# Patient Record
Sex: Female | Born: 1995 | Race: Black or African American | Hispanic: No | Marital: Single | Smoking: Former smoker
Health system: Southern US, Community
[De-identification: ages and names within clinical notes are randomized; demographics above are authoritative.]

## PROBLEM LIST (undated history)

## (undated) DIAGNOSIS — L509 Urticaria, unspecified: Secondary | ICD-10-CM

## (undated) HISTORY — DX: Urticaria, unspecified: L50.9

---
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
## 2017-04-08 ENCOUNTER — Encounter: Payer: Self-pay | Admitting: Allergy & Immunology

## 2017-04-08 ENCOUNTER — Ambulatory Visit (INDEPENDENT_AMBULATORY_CARE_PROVIDER_SITE_OTHER): Payer: Medicaid Other | Admitting: Allergy & Immunology

## 2017-04-08 VITALS — BP 118/70 | HR 88 | Resp 16 | Ht 65.0 in | Wt 226.0 lb

## 2017-04-08 DIAGNOSIS — L508 Other urticaria: Secondary | ICD-10-CM

## 2017-04-08 NOTE — Progress Notes (Signed)
FOLLOW UP  Date of Service/Encounter:  04/08/17   Assessment:   Chronic urticaria - minimally responsive to antihistamines  P/SAR (grasses, trees, weeds, dust mites, cat, dog) - without many clinical symptoms  Plan/Recommendations:   1. Chronic urticaria - Your history does not have any "red flags" such as fevers, joint pains, or permanent skin changes that would be concerning for a more serious cause of hives.  - We will get some labs to rule out serious causes of hives: complete blood count, complete metabolic panel, tryptase level, thyroid function testing, thyroid antibodies, CMP, ESR, and CRP. - Chronic hives are often times a self limited process and will "burn themselves out" over 6-12 months, although this is not always the case.  - In the meantime, start suppressive dosing of antihistamines:   - Morning: Xyzal (levocetirizine) 5-92m (one to two tablets)  - Evening: Singulair (montelukast) 122mnightly  - If the above is not working, try adding: Zantac (ranitidine) 30018mtwo tablets) - You can change this dosing at home, decreasing the dose as needed or increasing the dosing as needed.  - We will work on getting approval for Xolair (this should be approved by next week I anticipate). - Risks/benefits of Xolair discussed with the patient and she would like to proceed with this.  2. Return in about 3 months (around 07/06/2017).  Subjective:   Lauren Oconnell a 21 50o. female presenting today for follow up of  Chief Complaint  Patient presents with  . Urticaria    hives are coming back     Lauren Oconnell a history of the following: Patient Active Problem List   Diagnosis Date Noted  . Postpartum care and examination 08/24/2016    History obtained from: chart review and patient.  Lauren Oconnell's Primary Care Provider is Lauren Oconnell.     Lauren Oconnell a 21 55o. female presenting for a follow up visit. She has a history of chronic urticaria. I last saw  her in November 2017. At that time, her hives seems to have improved with her gestation (13 weeks at the time). I recommended that she use Claritin or Zyrtec as needed during her pregnancy.   Since the last visit, she has mostly done well. The remainder of her pregnancy did very well. She now has a nin75d boy named Lauren Oconnell Posto is meeting all of his milestones without a problem. He is now crawling and even taking 1-2 steps. He is a daddy's boy, which is somewhat bothersome to [facility] staying at home with Mom while his father works at Lauren Oconnell hives had remained under good control until around two months ago. She started having breakouts from unknown triggers, which she was treating with Benadryl. This does provide very transient relief. She has been on cetirizine in the past, which did not seem to provide much control. 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 She is interested in additional therapies at this time.   She denies problems with fevers or joint pains. There are no permanent skin changes whatsoever. She denies problems with her thyroid function. She did have environmental allergy testing performed in June 2017 that showed positives to grasses, weeds, trees, dog, cat, and dust mites.   Otherwise, there have been no changes to her past medical history, surgical history, family history, or social history.    Review of  Systems: a 14-point review of systems is pertinent for what is mentioned in HPI.  Otherwise, all other systems were negative. Constitutional: negative other than that listed in the HPI Eyes: negative other than that listed in the HPI Ears, nose, mouth, throat, and face: negative other than that listed in the HPI Respiratory: negative other than that listed in the HPI Cardiovascular: negative other than that listed in the HPI Gastrointestinal: negative other than that listed in the  HPI Genitourinary: negative other than that listed in the HPI Integument: negative other than that listed in the HPI Hematologic: negative other than that listed in the HPI Musculoskeletal: negative other than that listed in the HPI Neurological: negative other than that listed in the HPI Allergy/Immunologic: negative other than that listed in the HPI    Objective:   Blood pressure 118/70, pulse 88, resp. rate 16, height _0  (1.651 m), weight 226 lb (102.5 kg), not currently breastfeeding. Body mass index is 37.61 kg/m.   Physical Exam:  General: Alert, interactive, in no acute distress. Pleasant female. Smiling.  Eyes: No conjunctival injection bilaterally, no discharge on the right, no discharge on the left and no Horner-Trantas dots present. PERRL bilaterally. EOMI without pain. No photophobia.  Ears: Right TM pearly gray with normal light reflex, Left TM pearly gray with normal light reflex, Right TM intact without perforation and Left TM intact without perforation.  Nose/Throat: External nose within normal limits and septum midline. Turbinates edematous and pale with clear discharge. Posterior oropharynx erythematous without cobblestoning in the posterior oropharynx. Tonsils 2+ without exudates.  Tongue without thrush. Adenopathy: no enlarged lymph nodes appreciated in the anterior cervical, occipital, axillary, epitrochlear, inguinal, or popliteal regions. Lungs: Clear to auscultation without wheezing, rhonchi or rales. No increased work of breathing. CV: Normal S1/S2. No murmurs. Capillary refill <2 seconds.  Skin: Scattered erythematous urticarial type lesions primarily located bilateral arms. No dermatographism present. Nonvesicular. Neuro:   Grossly intact. No focal deficits appreciated. Responsive to questions.  Diagnostic studies: none      Lauren Marvel, MD [place] [facility] [place]

## 2017-04-08 NOTE — Patient Instructions (Addendum)
1. Chronic urticaria - Your history does not have any "red flags" such as fevers, joint pains, or permanent skin changes that would be concerning for a more serious cause of hives.  - We will get some labs to rule out serious causes of hives: complete blood count, complete metabolic panel, tryptase level, thyroid function testing, thyroid antibodies, CMP, ESR, and CRP. - Chronic hives are often times a self limited process and will "burn themselves out" over 6-12 months, although this is not always the case.  - In the meantime, start suppressive dosing of antihistamines:   - Morning: Xyzal (levocetirizine) 5-40m (one to two tablets)  - Evening: Singulair (montelukast) 158mnightly  - If the above is not working, try adding: Zantac (ranitidine) 30057mtwo tablets) - You can change this dosing at home, decreasing the dose as needed or increasing the dosing as needed.  - We will work on getting approval for Xolair (this should be approved by next week I anticipate).  2. Return in about 3 months (around 07/06/2017).   Please inform us [place] any Emergency Department visits, hospitalizations, or changes in symptoms. Call us [place] going to the ED for breathing or allergy symptoms since we might be able to fit you in for a sick visit. Feel free to contact us [place] with any questions, problems, or concerns.  It was a pleasure to see you again today! Happy New Year! Bring CarEulas Postxt time! I can't wait to meet the little dude!   Websites that have reliable patient information: 1. American Academy of Asthma, Allergy, and Immunology: www.aaaai.org 2. Food Allergy Research and Education (FARE): foodallergy.org 3. Mothers of Asthmatics: http://www.asthmacommunitynetwork.org 4. American College of Allergy, Asthma, and Immunology: www.acaai.org

## 2017-04-15 ENCOUNTER — Telehealth: Payer: Self-pay | Admitting: *Deleted

## 2017-04-15 NOTE — Telephone Encounter (Signed)
Per Dr Dellis AnesGallagher mail patient dust mite avoidance measures. Mailed to patient

## 2017-04-19 ENCOUNTER — Encounter: Payer: Self-pay | Admitting: *Deleted

## 2017-04-19 LAB — ALLERGENS, ZONE 2
Alternaria Alternata IgE: <0.1 kU/L
Amer Sycamore IgE Qn: <0.1 kU/L
Aspergillus Fumigatus IgE: <0.1 kU/L
Bahia Grass IgE: <0.1 kU/L
Cat Dander IgE: <0.1 kU/L
Cedar, Mountain IgE: 0.12 kU/L — AB
Cladosporium Herbarum IgE: <0.1 kU/L
D Farinae IgE: 9.12 kU/L — AB
D Pteronyssinus IgE: 6.24 kU/L — AB
Dog Dander IgE: 0.52 kU/L — AB
Hickory, White IgE: 0.52 kU/L — AB
I206-IGE COCKROACH, AMERICAN: 0.87 kU/L — AB
Maple/Box Elder IgE: <0.1 kU/L
Mugwort IgE Qn: <0.1 kU/L
Nettle IgE: 0.14 kU/L — AB
Penicillium Chrysogen IgE: <0.1 kU/L
Pigweed, Rough IgE: 0.14 kU/L — AB
Ragweed, Short IgE: <0.1 kU/L
Sheep Sorrel IgE Qn: <0.1 kU/L
Stemphylium Herbarum IgE: <0.1 kU/L
TIMOTHY IGE: 0.13 kU/L — AB
W009-IGE PLANTAIN, ENGLISH: 0.12 kU/L — AB
White Mulberry IgE: <0.1 kU/L

## 2017-04-19 LAB — C-REACTIVE PROTEIN: CRP: 9.9 mg/L — AB (ref 0.0–4.9)

## 2017-04-19 LAB — COMPREHENSIVE METABOLIC PANEL
ALBUMIN: 4.5 g/dL (ref 3.5–5.5)
ALK PHOS: 98 IU/L (ref 39–117)
ALT: 12 IU/L (ref 0–32)
AST: 14 IU/L (ref 0–40)
Albumin/Globulin Ratio: 1.6 (ref 1.2–2.2)
BUN / CREAT RATIO: 12 (ref 9–23)
BUN: 8 mg/dL (ref 6–20)
Bilirubin Total: <0.2 mg/dL (ref 0.0–1.2)
CO2: 22 mmol/L (ref 20–29)
CREATININE: 0.69 mg/dL (ref 0.57–1.00)
Calcium: 9.3 mg/dL (ref 8.7–10.2)
Chloride: 108 mmol/L — ABNORMAL HIGH (ref 96–106)
GFR calc Af Amer: 146 mL/min/{1.73_m2} (ref >59)
GFR calc non Af Amer: 127 mL/min/{1.73_m2} (ref >59)
GLOBULIN, TOTAL: 2.9 g/dL (ref 1.5–4.5)
Glucose: 82 mg/dL (ref 65–99)
Potassium: 4.2 mmol/L (ref 3.5–5.2)
SODIUM: 139 mmol/L (ref 134–144)
Total Protein: 7.4 g/dL (ref 6.0–8.5)

## 2017-04-19 LAB — TRYPTASE: TRYPTASE: 5.2 ug/L (ref 2.2–13.2)

## 2017-04-19 LAB — CBC
HEMATOCRIT: 38.8 % (ref 34.0–46.6)
HEMOGLOBIN: 12.8 g/dL (ref 11.1–15.9)
MCH: 26.7 pg (ref 26.6–33.0)
MCHC: 33 g/dL (ref 31.5–35.7)
MCV: 81 fL (ref 79–97)
Platelets: 314 10*3/uL (ref 150–379)
RBC: 4.79 x10E6/uL (ref 3.77–5.28)
RDW: 14.9 % (ref 12.3–15.4)
WBC: 6.6 10*3/uL (ref 3.4–10.8)

## 2017-04-19 LAB — TSH+T3+FREE T4+T3 FREE
Free T-3: 2.9 pg/mL
Free T4 by Dialysis: 0.54 ng/dL
TSH: 1.4 uU/mL
Triiodothyronine (T-3), Serum: 127 ng/dL

## 2017-04-19 LAB — SEDIMENTATION RATE: SED RATE: 30 mm/h (ref 0–32)

## 2017-04-19 LAB — ANA: ANA: NEGATIVE

## 2017-04-19 LAB — THYROGLOBULIN LEVEL: Thyroglobulin (TG-RIA): 110 ng/mL — ABNORMAL HIGH

## 2017-04-19 LAB — THYROID PEROXIDASE ANTIBODY: Thyroperoxidase Ab SerPl-aCnc: 20 IU/mL (ref 0–26)

## 2017-04-22 ENCOUNTER — Ambulatory Visit: Payer: Medicaid Other

## 2017-04-29 ENCOUNTER — Ambulatory Visit (INDEPENDENT_AMBULATORY_CARE_PROVIDER_SITE_OTHER): Payer: Medicaid Other | Admitting: *Deleted

## 2017-04-29 DIAGNOSIS — L508 Other urticaria: Secondary | ICD-10-CM

## 2017-04-29 DIAGNOSIS — L501 Idiopathic urticaria: Secondary | ICD-10-CM

## 2017-04-29 MED ORDER — OMALIZUMAB 150 MG ~~LOC~~ SOLR
300.0000 mg | SUBCUTANEOUS | Status: AC
  Administered 2017-04-29 – 2019-01-16 (×3): 300 mg via SUBCUTANEOUS

## 2017-04-29 NOTE — Progress Notes (Signed)
Immunotherapy   Patient Details  Name: Lauren Oconnell MRN: [id number] Date of Birth: 10/21/1995  04/29/2017  Lauren Oconnell started injections for  Xolair 300 mg every 28 days. Epi-Pen:Epi-Pen Available  Consent signed and patient instructions given. No problems after 60 minutes in the office.   Lauren DuvalHeather L Regnald Bowens 04/29/2017, 6:06 PM

## 2017-05-27 ENCOUNTER — Ambulatory Visit: Payer: Medicaid Other

## 2018-01-16 ENCOUNTER — Emergency Department (HOSPITAL_COMMUNITY)
Admission: EM | Admit: 2018-01-16 | Discharge: 2018-01-16 | Disposition: A | Discharge status: Home / Self Care | Payer: Medicaid Other | Attending: Emergency Medicine | Admitting: Emergency Medicine

## 2018-01-16 ENCOUNTER — Encounter: Payer: Self-pay | Admitting: Emergency Medicine

## 2018-01-16 DIAGNOSIS — J029 Acute pharyngitis, unspecified: Secondary | ICD-10-CM

## 2018-01-16 DIAGNOSIS — R51 Headache: Secondary | ICD-10-CM | POA: Insufficient documentation

## 2018-01-16 DIAGNOSIS — F17200 Nicotine dependence, unspecified, uncomplicated: Secondary | ICD-10-CM | POA: Insufficient documentation

## 2018-01-16 DIAGNOSIS — R110 Nausea: Secondary | ICD-10-CM | POA: Insufficient documentation

## 2018-01-16 LAB — GROUP A STREP BY PCR: GROUP A STREP BY PCR: NOT DETECTED

## 2018-01-16 MED ORDER — DEXAMETHASONE 10 MG/ML FOR PEDIATRIC ORAL USE
10.0000 mg | Freq: Once | INTRAMUSCULAR | Status: AC
  Administered 2018-01-16: 10 mg via ORAL
  Filled 2018-01-16 (×2): qty 1

## 2018-01-16 MED ORDER — IBUPROFEN 100 MG/5ML PO SUSP
600.0000 mg | Freq: Once | ORAL | Status: AC
Start: 2018-01-16 — End: 2018-01-16
  Administered 2018-01-16: 600 mg via ORAL
  Filled 2018-01-16: qty 30

## 2018-01-16 NOTE — ED Notes (Signed)
Pt stable, ambulatory, states understanding of discharge instructions 

## 2018-01-16 NOTE — ED Provider Notes (Signed)
[facility] EMERGENCY DEPARTMENT Provider Note   CSN: [id number] Arrival date & time: 01/16/18  1506     History   Chief Complaint Chief Complaint  Patient presents with  . Sore Throat    HPI Lauren Oconnell is a 22 y.o. female.  22 year old female presents with complaint of sore throat onset last night.  Pain is worse with swallowing.  Denies associated sneezing, cough, congestion.  No sick contacts.  Patient states she looked in her throat this afternoon and noticed white patches on her tonsils.  Also tender cervical lymph nodes, with mild headache and nausea. Denies vomiting, fevers, chills. No other complaints.      Past Medical History:  Diagnosis Date  . Urticaria     Patient Active Problem List   Diagnosis Date Noted  . Postpartum care and examination 08/24/2016    Past Surgical History:  Procedure Laterality Date  . WISDOM TOOTH EXTRACTION  2016     OB History    Gravida  1   Para  1   Term  1   Preterm      AB      Living  1     SAB      TAB      Ectopic      Multiple      Live Births  1            Home Medications    Prior to Admission medications   Medication Sig Start Date End Date Taking? Authorizing Provider  diphenhydrAMINE (BENADRYL) 25 mg capsule Take 25 mg by mouth every 6 (six) hours as needed.    [provider]    Family History Family History  Problem Relation Age of Onset  . Asthma Brother   . Allergic rhinitis Brother   . Food Allergy Mother   . Hypertension Mother   . Angioedema Neg Hx   . Eczema Neg Hx   . Immunodeficiency Neg Hx   . Urticaria Neg Hx     Social History Social History   Tobacco Use  . Smoking status: Current Some Day Smoker  . Smokeless tobacco: Never Used  Substance Use Topics  . Alcohol use: No  . Drug use: Yes    Types: Marijuana     Allergies   Patient has no known allergies.   Review of Systems Review of Systems  Constitutional: Negative  for chills and fever.  HENT: Positive for sore throat. Negative for congestion, ear pain, postnasal drip, rhinorrhea, sinus pressure, sinus pain, sneezing and trouble swallowing.   Eyes: Negative for discharge and redness.  Respiratory: Negative for cough.   Gastrointestinal: Positive for nausea. Negative for vomiting.  Musculoskeletal: Negative for neck pain and neck stiffness.  Skin: Negative for rash and wound.  Allergic/Immunologic: Negative for immunocompromised state.  Neurological: Positive for headaches. Negative for weakness.  Hematological: Positive for adenopathy.  Psychiatric/Behavioral: Negative for confusion.  All other systems reviewed and are negative.    Physical Exam Updated Vital Signs BP 134/82 (BP Location: Right Arm)   Pulse 87   Temp 98.3 F (36.8 C) (Oral)   Resp 17   Ht 5\' 4"  (1.626 m)   Wt 102.5 kg   LMP 01/09/2018 (Exact Date)   SpO2 100%   BMI 38.79 kg/m   Physical Exam  Constitutional: She is oriented to person, place, and time. She appears well-developed and well-nourished. No distress.  HENT:  Head: Normocephalic and atraumatic.  Right Ear:  Tympanic membrane and ear canal normal.  Left Ear: Tympanic membrane and ear canal normal.  Mouth/Throat: Uvula is midline and mucous membranes are normal. No uvula swelling. No oropharyngeal exudate, posterior oropharyngeal edema, posterior oropharyngeal erythema or tonsillar abscesses. Tonsils are 2+ on the right. Tonsils are 2+ on the left. Tonsillar exudate.  Cardiovascular: Normal rate, regular rhythm and normal heart sounds.  No murmur heard. Pulmonary/Chest: Effort normal and breath sounds normal.  Lymphadenopathy:       Head (right side): Tonsillar adenopathy present.       Head (left side): Tonsillar adenopathy present.    She has cervical adenopathy.       Right cervical: Superficial cervical adenopathy present.       Left cervical: Superficial cervical adenopathy present.  Neurological: She is  alert and oriented to person, place, and time.  Skin: Skin is warm and dry. She is not diaphoretic.  Psychiatric: She has a normal mood and affect. Her behavior is normal.  Nursing note and vitals reviewed.    ED Treatments / Results  Labs (all labs ordered are listed, but only abnormal results are displayed) Labs Reviewed  GROUP A STREP BY PCR    EKG None  Radiology No results found.  Procedures Procedures (including critical care time)  Medications Ordered in ED Medications  dexamethasone (DECADRON) 10 MG/ML injection for Pediatric ORAL use 10 mg (10 mg Oral Given 01/16/18 1607)  ibuprofen (ADVIL,MOTRIN) 100 MG/5ML suspension 600 mg (600 mg Oral Given 01/16/18 1607)     Initial Impression / Assessment and Plan / ED Course  I have reviewed the triage vital signs and the nursing notes.  Pertinent labs & imaging results that were available during my care of the patient were reviewed by me and considered in my medical decision making (see chart for details).  Clinical Course as of Jan 16 1814  Wynelle LinkSun Jan 16, 2018  [id number] 22 year old female with complaint of sore throat onset last night.  Patient has tender anterior cervical lymphadenopathy with white exudate on enlarged tonsils.  Rapid strep test is negative.  Patient was given Decadron and Motrin while in the ER.  Gust possibility of mono or other viral pharyngitis.  Recommend Motrin and Tylenol, symptomatic treatment and follow-up with PCP.  Return to ER for severe concerning symptoms.   [LM]    Clinical Course User Index [LM] Jeannie FendMurphy,  A, PA-C   Final Clinical Impressions(s) / ED Diagnoses   Final diagnoses:  Acute pharyngitis, unspecified etiology    ED Discharge Orders    None       Jeannie FendMurphy,  A, PA-C 01/16/18 1815    Tilden Fossaees, Elizabeth, MD 01/16/18 [phone]

## 2018-01-16 NOTE — ED Triage Notes (Signed)
Pt c/o sore throat and hoarse voice since yesterday

## 2018-01-16 NOTE — Discharge Instructions (Addendum)
Gargle warm salt water, drink warm tea with honey. Motrin and Tylenol as needed for pain. Recheck with your doctor, return to ER for new or worsening symptoms.

## 2018-11-15 ENCOUNTER — Other Ambulatory Visit: Payer: Self-pay | Admitting: Allergy & Immunology

## 2018-11-15 ENCOUNTER — Encounter: Payer: Self-pay | Admitting: Allergy & Immunology

## 2018-11-15 ENCOUNTER — Other Ambulatory Visit: Payer: Self-pay

## 2018-11-15 ENCOUNTER — Ambulatory Visit (INDEPENDENT_AMBULATORY_CARE_PROVIDER_SITE_OTHER): Payer: Managed Care, Other (non HMO) | Admitting: Allergy & Immunology

## 2018-11-15 VITALS — BP 122/60 | HR 84 | Temp 98.0°F | Resp 16 | Ht 64.0 in | Wt 193.8 lb

## 2018-11-15 DIAGNOSIS — L500 Allergic urticaria: Secondary | ICD-10-CM

## 2018-11-15 DIAGNOSIS — J302 Other seasonal allergic rhinitis: Secondary | ICD-10-CM

## 2018-11-15 DIAGNOSIS — J3089 Other allergic rhinitis: Secondary | ICD-10-CM | POA: Diagnosis not present

## 2018-11-15 MED ORDER — MONTELUKAST SODIUM 10 MG PO TABS: 10.0000 mg | ORAL_TABLET | Freq: Every day | ORAL | 5 refills | Status: AC

## 2018-11-15 MED ORDER — LEVOCETIRIZINE DIHYDROCHLORIDE 5 MG PO TABS: ORAL_TABLET | ORAL | 5 refills | Status: DC

## 2018-11-15 NOTE — Progress Notes (Signed)
FOLLOW UP  Date of Service/Encounter:  11/15/18   Assessment:   Chronic urticaria - wishing to restart Xolair  P/SAR (grasses, trees, weeds, dust mites, cat, dog) - without many clinical symptoms  Plan/Recommendations:   1. Chronic urticaria - with sensitizations to grasses, trees, weeds, dust mites, cat, dog - We are going to start a prednisone pack today to help control the hives more acutely.  - We will restart the Xolair (this is a monthly shot). - Restart suppressive dosing of antihistamines:   - Morning: Xyzal (levocetirizine) 5-10mg  (one to two tablets)  - Evening: Singulair (montelukast) 10mg  nightly - You can change this dosing at home, decreasing the dose as needed or increasing the dosing as needed.   2. Return in about 3 months (around 02/14/2019). This can be an in-person, a virtual Webex or a telephone follow up visit   Subjective:   FATIHA GUZY is a 23 y.o. female presenting today for follow up of  Chief Complaint  Patient presents with   Urticaria    hives coming back    DONDA FRIEDLI has a history of the following: Patient Active Problem List   Diagnosis Date Noted   Postpartum care and examination 08/24/2016    History obtained from: chart review and patient.  Samariah is a 23 y.o. female presenting for a follow up visit.  She was last seen in the office in February 2019.  At that time, she had testing to environmental allergens that was positive to grasses, trees, weeds, dust mite, cat, and dog.  However, she was having minimal allergic rhinitis symptoms.  For her urticaria, we obtain lab work to rule out serious causes of urticaria which was mostly normal.  She did have cockroach that was positive on environmental panel, but otherwise the remainder of the labs were normal.  We started Xyzal 1 to 2 tablets in the morning and Singulair 10 mg at night.  She did want to start Xolair since she had been having hives for an extended period of time.  This  was approved and she received 1 dose of Xolair on April 29, 2017.  However, she has not followed up for additional injections since then.  Since the last visit, she has done well. She got her Xolair injection at the end of February but she stopped coming back since she was hive free. She did not know that she needed to come back.   Recently, her hives came back one month ago. She has been using Benadryl 2-3 times daily. Hives do not really change with Benadryl. She took two Benadryl before work yesterday and then two more this morning before work. She has been having drowsiness from this.   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 She was previously working at [employer] but likes this current job more.   Otherwise, there have been no changes to her past medical history, surgical history, family history, or social history.    Review of Systems  Constitutional: Negative.  Negative for fever, malaise/fatigue and weight loss.  HENT: Negative.  Negative for congestion, ear discharge and ear pain.   Eyes: Negative for pain, discharge and redness.  Respiratory: Negative for cough, sputum production, shortness of breath and wheezing.   Cardiovascular: Negative.  Negative for chest pain and palpitations.  Gastrointestinal: Negative for abdominal pain, heartburn, nausea and vomiting.  Skin: Positive for itching and rash.  Neurological: Negative  for dizziness and headaches.  Endo/Heme/Allergies: Negative for environmental allergies. Does not bruise/bleed easily.       Objective:   Blood pressure 122/60, pulse 84, temperature 98 F (36.7 C), temperature source Temporal, resp. rate 16, height 5\' 4"  (1.626 m), weight 193 lb 12.8 oz (87.9 kg), SpO2 99 %. Body mass index is 33.27 kg/m.   Physical Exam:  Physical Exam  Constitutional: She appears well-developed.  Pleasant.  Talkative.  HENT:  Head: Normocephalic and atraumatic.    Right Ear: Tympanic membrane, external ear and ear canal normal.  Left Ear: Tympanic membrane, external ear and ear canal normal.  Nose: Mucosal edema and rhinorrhea present. No nasal deformity or septal deviation. No epistaxis. Right sinus exhibits no maxillary sinus tenderness and no frontal sinus tenderness. Left sinus exhibits no maxillary sinus tenderness and no frontal sinus tenderness.  Mouth/Throat: Uvula is midline and oropharynx is clear and moist. Mucous membranes are not pale and not dry.  Allergic shiners bilaterally.  Eyes: Pupils are equal, round, and reactive to light. Conjunctivae and EOM are normal. Right eye exhibits no chemosis and no discharge. Left eye exhibits no chemosis and no discharge. Right conjunctiva is not injected. Left conjunctiva is not injected.  Cardiovascular: Normal rate, regular rhythm and normal heart sounds.  Respiratory: Effort normal and breath sounds normal. No accessory muscle usage. No tachypnea. No respiratory distress. She has no wheezes. She has no rhonchi. She has no rales. She exhibits no tenderness.  Moving air well in all lung fields.   Lymphadenopathy:    She has no cervical adenopathy.  Neurological: She is alert.  Skin: No abrasion, no petechiae and no rash noted. Rash is not papular, not vesicular and not urticarial. No erythema. No pallor.  There are some stretch marks appreciated on her bilateral arms.   Psychiatric: She has a normal mood and affect.     Diagnostic studies: none       Malachi BondsJoel Nichole Neyer, MD  [facility]

## 2018-11-15 NOTE — Patient Instructions (Addendum)
1. Chronic urticaria - with sensitizations to grasses, trees, weeds, dust mites, cat, dog - We are going to start a prednisone pack today to help control the hives more acutely.  - We will restart the Xolair (this is a monthly shot). - Restart suppressive dosing of antihistamines:   - Morning: Xyzal (levocetirizine) 5-10mg  (one to two tablets)  - Evening: Singulair (montelukast) 10mg  nightly - You can change this dosing at home, decreasing the dose as needed or increasing the dosing as needed.   2. Return in about 3 months (around 02/14/2019). This can be an in-person, a virtual Webex or a telephone follow up visit.   Please inform us of any Emergency Department visits, hospitalizations, or changes in symptoms. Call us before going to the ED for breathing or allergy symptoms since we might be able to fit you in for a sick visit. Feel free to contact us anytime with any questions, problems, or concerns.  It was a pleasure to see you again today!  Websites that have reliable patient information: 1. American Academy of Asthma, Allergy, and Immunology: www.aaaai.org 2. Food Allergy Research and Education (FARE): foodallergy.org 3. Mothers of Asthmatics: http://www.asthmacommunitynetwork.org 4. American College of Allergy, Asthma, and Immunology: www.acaai.org  "Like" [place] on Facebook and Instagram for our latest updates!      Make sure you are registered to vote! If you have moved or changed any of your contact information, you will need to get this updated before voting!  In some cases, you MAY be able to register to vote online: [email or web address]    Voter ID laws are NOT going into effect for the General Election in November 2020! DO NOT let this stop you from exercising your right to vote!   Absentee voting is the SAFEST way to vote during the coronavirus pandemic!   Download and print an absentee ballot request form at [email or web address]-Request or  you can scan the QR code below with your smart phone:      More information on absentee ballots can be found here: [email or web address]

## 2018-11-16 ENCOUNTER — Telehealth: Payer: Self-pay | Admitting: *Deleted

## 2018-11-16 NOTE — Telephone Encounter (Signed)
Called patient and advised I have her Xolair approved but need copy of her Ins card.  She is going to drop it by office today.  I explained submit [facility] with copay card and they will reach out to patient to set up shipment to office.

## 2018-11-16 NOTE — Telephone Encounter (Signed)
-----   Message from Valentina Shaggy, MD sent at 11/15/2018 10:36 AM EDT ----- Wants to restart Xolair.

## 2018-11-17 ENCOUNTER — Telehealth: Payer: Self-pay | Admitting: *Deleted

## 2018-11-17 NOTE — Telephone Encounter (Signed)
I had reached out to patient yesterday to advise submit for Xolair and needed Ins card which she was to bring by.  Patient did not bring card by so I will wait to submit until we have her card

## 2018-11-22 ENCOUNTER — Telehealth: Payer: Self-pay | Admitting: *Deleted

## 2018-11-22 ENCOUNTER — Encounter: Payer: Self-pay | Admitting: Allergy & Immunology

## 2018-11-23 NOTE — Telephone Encounter (Signed)
error 

## 2018-12-14 ENCOUNTER — Ambulatory Visit: Payer: Managed Care, Other (non HMO)

## 2018-12-19 ENCOUNTER — Other Ambulatory Visit: Payer: Self-pay

## 2018-12-19 ENCOUNTER — Ambulatory Visit (INDEPENDENT_AMBULATORY_CARE_PROVIDER_SITE_OTHER): Payer: Managed Care, Other (non HMO)

## 2018-12-19 DIAGNOSIS — L501 Idiopathic urticaria: Secondary | ICD-10-CM

## 2018-12-19 DIAGNOSIS — L508 Other urticaria: Secondary | ICD-10-CM

## 2018-12-19 NOTE — Progress Notes (Signed)
Immunotherapy   Patient Details  Name: Lauren Oconnell MRN: [id number] Date of Birth: [birth year]  12/19/2018  [name] restarted Xolair. Patient received 300 mg dose today. Patient did not wait 30 minutes. Frequency: Every 4 weeks Epi-Pen: Yes Consent signed and patient instructions given.   Herbie Drape 12/19/2018, 3:40 PM

## 2018-12-27 ENCOUNTER — Encounter: Payer: Self-pay | Admitting: Advanced Practice Midwife

## 2018-12-27 ENCOUNTER — Ambulatory Visit (INDEPENDENT_AMBULATORY_CARE_PROVIDER_SITE_OTHER): Payer: Managed Care, Other (non HMO) | Admitting: Advanced Practice Midwife

## 2018-12-27 ENCOUNTER — Other Ambulatory Visit (HOSPITAL_COMMUNITY)
Admission: RE | Admit: 2018-12-27 | Discharge: 2018-12-27 | Disposition: A | Discharge status: Home / Self Care | Payer: Managed Care, Other (non HMO) | Source: Ambulatory Visit | Attending: Advanced Practice Midwife | Admitting: Advanced Practice Midwife

## 2018-12-27 ENCOUNTER — Other Ambulatory Visit: Payer: Self-pay

## 2018-12-27 VITALS — BP 119/81 | HR 70 | Temp 97.8°F | Ht 64.0 in | Wt 197.6 lb

## 2018-12-27 DIAGNOSIS — Z30011 Encounter for initial prescription of contraceptive pills: Secondary | ICD-10-CM | POA: Insufficient documentation

## 2018-12-27 DIAGNOSIS — Z113 Encounter for screening for infections with a predominantly sexual mode of transmission: Secondary | ICD-10-CM | POA: Diagnosis present

## 2018-12-27 DIAGNOSIS — N760 Acute vaginitis: Secondary | ICD-10-CM

## 2018-12-27 DIAGNOSIS — Z01419 Encounter for gynecological examination (general) (routine) without abnormal findings: Secondary | ICD-10-CM | POA: Diagnosis not present

## 2018-12-27 DIAGNOSIS — N898 Other specified noninflammatory disorders of vagina: Secondary | ICD-10-CM

## 2018-12-27 DIAGNOSIS — B9689 Other specified bacterial agents as the cause of diseases classified elsewhere: Secondary | ICD-10-CM

## 2018-12-27 DIAGNOSIS — Z3009 Encounter for other general counseling and advice on contraception: Secondary | ICD-10-CM

## 2018-12-27 DIAGNOSIS — R102 Pelvic and perineal pain: Secondary | ICD-10-CM

## 2018-12-27 MED ORDER — NORETHIN-ETH ESTRAD-FE BIPHAS 1 MG-10 MCG / 10 MCG PO TABS: 1.0000 | ORAL_TABLET | Freq: Every day | ORAL | 11 refills | Status: AC

## 2018-12-27 MED ORDER — METRONIDAZOLE 0.75 % VA GEL
1.0000 | Freq: Every day | VAGINAL | 1 refills | Status: DC
Start: 2018-12-27 — End: 2021-04-02

## 2018-12-27 NOTE — Progress Notes (Signed)
Subjective:     Lauren Oconnell is a 23 y.o. female here for a routine exam.  Current complaints: Mild sharp pain in RLQ with intercourse.  Personal health questionnaire reviewed: yes.  Do you have a primary care provider? yes How many times per week do you exercise? 3 Do you feel safe at home? yes Has anyone hit, slapped, or kicked you recently? no Do you feel sad, tired, or upset most days or are you mostly happy with life? Mostly happy    Gynecologic History Patient's last menstrual period was 12/19/2018 (exact date). Contraception: none Last Pap: n/a.  Last mammogram: n/a.  Obstetric History OB History  Gravida Para Term Preterm AB Living  1 1 1     1   SAB TAB Ectopic Multiple Live Births          1    # Outcome Date GA Lbr Len/2nd Weight Sex Delivery Anes PTL Lv  1 Term 07/10/16 [redacted]w[redacted]d 06:59 / 00:29 6 lb 2.3 oz (2.787 kg) M Vag-Spont EPI, Local  LIV     Birth Comments: none     The following portions of the patient's history were reviewed and updated as appropriate: allergies, current medications, past family history, past medical history, past social history, past surgical history and problem list.  Review of Systems Pertinent items noted in HPI and remainder of comprehensive ROS otherwise negative.    Objective:     BP 119/81   Pulse 70   Temp 97.8 F (36.6 C)   Ht 5\' 4"  (1.626 m)   Wt 197 lb 9.6 oz (89.6 kg)   LMP 12/19/2018 (Exact Date)   BMI 33.92 kg/m   VS reviewed, nursing note reviewed,  Constitutional: well developed, well nourished, no distress HEENT: normocephalic CV: normal rate Pulm/chest wall: normal effort Breast Exam:  right breast normal without mass, skin or nipple changes or axillary nodes, left breast normal without mass, skin or nipple changes or axillary nodes Abdomen: soft Neuro: alert and oriented x 3 Skin: warm, dry Psych: affect normal Pelvic exam: Cervix pink, visually closed, without lesion, moderate thin white discharge, vaginal  walls and external genitalia normal Bimanual exam: Cervix 0/long/high, firm, anterior, neg CMT, uterus nontender, nonenlarged, adnexa without tenderness, enlargement, or mass     Assessment/Plan:   1. Well woman exam with routine gynecological exam  - Cytology - PAP( [facility])  2. Screening examination for STD (sexually transmitted disease)  - Cervicovaginal ancillary only( [place]) - RPR - HIV antibody (with reflex) - Hepatitis C Antibody - Hepatitis B Surface AntiGEN  3. Vaginal discharge  - Cervicovaginal ancillary only( [place])  4. Encounter for counseling regarding contraception Discussed LARCs as most effective forms of birth control.  Discussed benefits/risks of other methods.  Pt desires OCPs.  Discussed failure rates of OCPs with regular use. Reviewed risk factors and s/sx of DVT/PE/reasons to seek medical care.  Rx for low dose OCP sent to pharmacy. Pt to f/u if menses remain heavy or breakthrough bleeding occurs so can change pill PRN.  - Norethindrone-Ethinyl Estradiol-Fe Biphas (LO LOESTRIN FE) 1 MG-10 MCG / 10 MCG tablet; Take 1 tablet by mouth daily.  Dispense: 1 Package; Refill: 11  5. Acute pelvic pain, female --Pain x 1 week, may be ovulatory in nature, vaginal swab pending.  Pt encouraged to f/u if persistent.  6. Bacterial vaginosis --clinical evidence of BV, swab pending - metroNIDAZOLE (METROGEL) 0.75 % vaginal gel; Place 1 Applicatorful vaginally at bedtime. Apply  one applicatorful to vagina at bedtime for 5 days  Dispense: 70 g; Refill: 1       Follow up in: 1 year or as needed.   Sharen Counter, CNM 3:41 PM

## 2018-12-27 NOTE — Progress Notes (Signed)
GYN presents for AEX/PAP/STD Testing.  Declined Flu vaccine. She is not using BC and does not want any.  GAD-7=2

## 2018-12-28 LAB — RPR: RPR Ser Ql: NONREACTIVE

## 2018-12-28 LAB — HEPATITIS C ANTIBODY: Hep C Virus Ab: <0.1 s/co ratio (ref 0.0–0.9)

## 2018-12-28 LAB — HEPATITIS B SURFACE ANTIGEN: Hepatitis B Surface Ag: NEGATIVE

## 2018-12-28 LAB — HIV ANTIBODY (ROUTINE TESTING W REFLEX): HIV Screen 4th Generation wRfx: NONREACTIVE

## 2018-12-29 LAB — CYTOLOGY - PAP: Diagnosis: NEGATIVE

## 2019-01-03 LAB — CERVICOVAGINAL ANCILLARY ONLY
Bacterial Vaginitis (gardnerella): POSITIVE — AB
Candida Glabrata: NEGATIVE
Candida Vaginitis: NEGATIVE
Chlamydia: POSITIVE — AB
Comment: NEGATIVE
Comment: NEGATIVE
Comment: NEGATIVE
Comment: NEGATIVE
Comment: NEGATIVE
Comment: NORMAL
Neisseria Gonorrhea: NEGATIVE
Trichomonas: POSITIVE — AB

## 2019-01-04 ENCOUNTER — Other Ambulatory Visit: Payer: Self-pay

## 2019-01-04 MED ORDER — METRONIDAZOLE 500 MG PO TABS: 500.0000 mg | ORAL_TABLET | Freq: Two times a day (BID) | ORAL | 0 refills | Status: DC

## 2019-01-04 MED ORDER — AZITHROMYCIN 500 MG PO TABS: 1000.0000 mg | ORAL_TABLET | Freq: Once | ORAL | Status: DC

## 2019-01-04 MED ORDER — FLUCONAZOLE 150 MG PO TABS: 150.0000 mg | ORAL_TABLET | Freq: Once | ORAL | 0 refills | Status: AC

## 2019-01-04 NOTE — Progress Notes (Signed)
Rx sent for +CH, +Tric.  Patient notified of results and Rx.

## 2019-01-09 ENCOUNTER — Encounter: Payer: Self-pay | Admitting: Advanced Practice Midwife

## 2019-01-09 DIAGNOSIS — A749 Chlamydial infection, unspecified: Secondary | ICD-10-CM | POA: Insufficient documentation

## 2019-01-09 DIAGNOSIS — A599 Trichomoniasis, unspecified: Secondary | ICD-10-CM | POA: Insufficient documentation

## 2019-01-16 ENCOUNTER — Ambulatory Visit (INDEPENDENT_AMBULATORY_CARE_PROVIDER_SITE_OTHER): Payer: Self-pay

## 2019-01-16 ENCOUNTER — Other Ambulatory Visit: Payer: Self-pay

## 2019-01-16 DIAGNOSIS — L501 Idiopathic urticaria: Secondary | ICD-10-CM

## 2019-01-16 DIAGNOSIS — L508 Other urticaria: Secondary | ICD-10-CM

## 2019-02-13 ENCOUNTER — Ambulatory Visit: Payer: Self-pay

## 2019-02-14 ENCOUNTER — Encounter (HOSPITAL_COMMUNITY): Payer: Self-pay

## 2019-02-14 ENCOUNTER — Other Ambulatory Visit: Payer: Self-pay

## 2019-02-14 ENCOUNTER — Ambulatory Visit: Payer: Medicaid Other | Admitting: Allergy & Immunology

## 2019-02-14 ENCOUNTER — Ambulatory Visit (HOSPITAL_COMMUNITY)
Admission: EM | Admit: 2019-02-14 | Discharge: 2019-02-14 | Disposition: A | Discharge status: Home / Self Care | Payer: Self-pay | Attending: Family Medicine | Admitting: Family Medicine

## 2019-02-14 DIAGNOSIS — Z3202 Encounter for pregnancy test, result negative: Secondary | ICD-10-CM

## 2019-02-14 DIAGNOSIS — M25512 Pain in left shoulder: Secondary | ICD-10-CM | POA: Insufficient documentation

## 2019-02-14 DIAGNOSIS — N898 Other specified noninflammatory disorders of vagina: Secondary | ICD-10-CM | POA: Insufficient documentation

## 2019-02-14 DIAGNOSIS — Z113 Encounter for screening for infections with a predominantly sexual mode of transmission: Secondary | ICD-10-CM | POA: Insufficient documentation

## 2019-02-14 LAB — POCT URINALYSIS DIP (DEVICE)
Glucose, UA: NEGATIVE mg/dL
Ketones, ur: NEGATIVE mg/dL
Nitrite: NEGATIVE
Protein, ur: NEGATIVE mg/dL
Specific Gravity, Urine: >=1.03 (ref 1.005–1.030)
Urobilinogen, UA: 1 mg/dL (ref 0.0–1.0)
pH: 6.5 (ref 5.0–8.0)

## 2019-02-14 LAB — POC URINE PREG, ED: Preg Test, Ur: NEGATIVE

## 2019-02-14 LAB — POCT PREGNANCY, URINE: Preg Test, Ur: NEGATIVE

## 2019-02-14 MED ORDER — AZITHROMYCIN 250 MG PO TABS
ORAL_TABLET | ORAL | Status: AC
  Filled 2019-02-14: qty 4

## 2019-02-14 MED ORDER — ACETAMINOPHEN 325 MG PO TABS: 650.0000 mg | ORAL_TABLET | Freq: Four times a day (QID) | ORAL | 0 refills | Status: AC | PRN

## 2019-02-14 MED ORDER — CEFTRIAXONE SODIUM 250 MG IJ SOLR
250.0000 mg | Freq: Once | INTRAMUSCULAR | Status: AC
  Administered 2019-02-14: 17:00:00 250 mg via INTRAMUSCULAR

## 2019-02-14 MED ORDER — AZITHROMYCIN 250 MG PO TABS
1000.0000 mg | ORAL_TABLET | Freq: Once | ORAL | Status: AC
  Administered 2019-02-14: 1000 mg via ORAL

## 2019-02-14 MED ORDER — CEFTRIAXONE SODIUM 250 MG IJ SOLR
INTRAMUSCULAR | Status: AC
  Filled 2019-02-14: qty 250

## 2019-02-14 NOTE — ED Provider Notes (Signed)
[facility]    CSN: [id number] Arrival date & time: 02/14/19  1515      History   Chief Complaint Chief Complaint  Patient presents with  . Abdominal Pain  . Vaginal Discharge  . Shoulder Pain    HPI Lauren Oconnell is a 23 y.o. female.   Lauren Oconnell is a 23 y.o. female who presents with complaints of  gradual vaginal discharge, abdominal pain and left shoulder ppain that began 3 days ago.  She denies a precipitating event, recent sexual encounter or recent antibiotic use.  Patient is sexually active with 1  female partners.  Describes discharge as  thin and white.  She has tried OTC medications without relief.  She reports similar symptoms in the past and was diagnosed with chlamydia and gonorrhea and treated with rocephin and azythromycin.  She denies fever, chills, nausea, vomiting, abdominal or pelvic pain, urinary symptoms, vaginal itching, vaginal odor, vaginal bleeding,  vaginal rashes or lesions.     The history is provided by the patient. No language interpreter was used.    Past Medical History:  Diagnosis Date  . Urticaria     Patient Active Problem List   Diagnosis Date Noted  . Chlamydia 01/09/2019  . Trichomonas infection 01/09/2019  . OCP (oral contraceptive pills) initiation 12/27/2018    Past Surgical History:  Procedure Laterality Date  . WISDOM TOOTH EXTRACTION  2016    OB History    Gravida  1   Para  1   Term  1   Preterm      AB      Living  1     SAB      TAB      Ectopic      Multiple      Live Births  1            Home Medications    Prior to Admission medications   Medication Sig Start Date End Date Taking? Authorizing Provider  diphenhydrAMINE (BENADRYL) 25 mg capsule Take 25 mg by mouth every 6 (six) hours as needed.    [provider]  levocetirizine (XYZAL) 5 MG tablet Take 1-2 tablets (5-10 mg total) by mouth every morning. 11/15/18   Valentina Shaggy, MD  metroNIDAZOLE (FLAGYL)  500 MG tablet Take 1 tablet (500 mg total) by mouth 2 (two) times daily. 01/04/19   Leftwich-Kirby, Kathie Dike, CNM  metroNIDAZOLE (METROGEL) 0.75 % vaginal gel Place 1 Applicatorful vaginally at bedtime. Apply one applicatorful to vagina at bedtime for 5 days 12/27/18   Leftwich-Kirby, Lattie Haw A, CNM  montelukast (SINGULAIR) 10 MG tablet Take 1 tablet (10 mg total) by mouth at bedtime. 11/15/18   Valentina Shaggy, MD  Norethindrone-Ethinyl Estradiol-Fe Biphas (LO LOESTRIN FE) 1 MG-10 MCG / 10 MCG tablet Take 1 tablet by mouth daily. 12/27/18   Leftwich-Kirby, Kathie Dike, CNM    Family History Family History  Problem Relation Age of Onset  . Asthma Brother   . Allergic rhinitis Brother   . Food Allergy Mother   . Hypertension Mother   . Angioedema Neg Hx   . Eczema Neg Hx   . Immunodeficiency Neg Hx   . Urticaria Neg Hx     Social History Social History   Tobacco Use  . Smoking status: Current Some Day Smoker  . Smokeless tobacco: Never Used  Substance Use Topics  . Alcohol use: No  . Drug use: Yes    Types: Marijuana  Allergies   Patient has no known allergies.   Review of Systems Review of Systems  Constitutional: Negative.   Respiratory: Negative.   Cardiovascular: Negative.   Gastrointestinal: Positive for abdominal pain.  Genitourinary: Positive for vaginal discharge.  Musculoskeletal:       Shoulder pain  Neurological: Negative.   Ros: all other are negative   Physical Exam Triage Vital Signs ED Triage Vitals  Enc Vitals Group     BP 02/14/19 1542 139/72     Pulse Rate 02/14/19 1542 89     Resp 02/14/19 1542 16     Temp 02/14/19 1542 98.9 F (37.2 C)     Temp Source 02/14/19 1542 Oral     SpO2 02/14/19 1542 100 %     Weight --      Height --      Head Circumference --      Peak Flow --      Pain Score 02/14/19 1543 7     Pain Loc --      Pain Edu? --      Excl. in GC? --    No data found.  Updated Vital Signs BP 139/72 (BP Location: Left Arm)    Pulse 89   Temp 98.9 F (37.2 C) (Oral)   Resp 16   LMP 02/13/2019   SpO2 100%   Visual Acuity Right Eye Distance:   Left Eye Distance:   Bilateral Distance:    Right Eye Near:   Left Eye Near:    Bilateral Near:     Physical Exam Constitutional:      General: She is not in acute distress.    Appearance: Normal appearance. She is well-developed and normal weight. She is not toxic-appearing.  Cardiovascular:     Rate and Rhythm: Normal rate and regular rhythm.     Pulses: Normal pulses.     Heart sounds: Normal heart sounds. No murmur.  Pulmonary:     Effort: Pulmonary effort is normal. No respiratory distress.     Breath sounds: Normal breath sounds. No wheezing.  Abdominal:     General: Abdomen is flat. Bowel sounds are normal.     Palpations: Abdomen is soft.     Tenderness: There is abdominal tenderness in the right lower quadrant and left lower quadrant. There is no right CVA tenderness or left CVA tenderness.  Genitourinary:    General: Normal vulva.     Vagina: No signs of injury and foreign body. Vaginal discharge present.     Rectum: Normal. Guaiac result negative.  Musculoskeletal:     Comments: Left shoulder tenderness/ full ROM/ full strength bilateral and equal  Skin:    Capillary Refill: Capillary refill takes less than 2 seconds.  Neurological:     Mental Status: She is alert and oriented to person, place, and time.      UC Treatments / Results  Labs (all labs ordered are listed, but only abnormal results are displayed) Labs Reviewed  POCT URINALYSIS DIP (DEVICE) - Abnormal; Notable for the following components:      Result Value   Bilirubin Urine SMALL (*)    Hgb urine dipstick TRACE (*)    Leukocytes,Ua SMALL (*)    All other components within normal limits  URINE CULTURE  POCT PREGNANCY, URINE  POC URINE PREG, ED  CERVICOVAGINAL ANCILLARY ONLY    EKG   Radiology No results found.  Procedures Procedures (including critical care  time)  Medications Ordered in UC Medications  cefTRIAXone (ROCEPHIN) injection 250 mg (has no administration in time range)  azithromycin (ZITHROMAX) tablet 1,000 mg (has no administration in time range)    Initial Impression / Assessment and Plan / UC Course  I have reviewed the triage vital signs and the nursing notes.  Pertinent labs & imaging results that were available during my care of the patient were reviewed by me and considered in my medical decision making (see chart for details).    Patient stable for discharge.  Benign physical exam.  UA show blood and white blood cell.  Urine pregnancy test was negative.  STD screening test was completed.  We will treat patient for gonorrhea and chlamydia.  Will call patient  when lab results become available and if abnormal  Final Clinical Impressions(s) / UC Diagnoses   Final diagnoses:  Vaginal discharge  Screen for STD (sexually transmitted disease)  Pain in joint of left shoulder     Discharge Instructions     UA was inconclusive and was sent for culture. Urine pregnancy test was negative. Given rocephin 250mg  injection and azithromycin 1g in office Urine cytology/ vaginal self swab/ cervical swab obtained Declines HIV/ syphilis testing today Take medications as prescribed and to completion We will follow up with you regarding the results of your test If tests are positive, please abstain from sexual activity for at least 7 days and notify partners Follow up with PCP if symptoms persists Return here or go to ER if you have any new or worsening symptoms       ED Prescriptions    None     PDMP not reviewed this encounter.   Durward Parcelvegno, Carrieanne Kleen S, FNP 02/14/19 [phone]

## 2019-02-14 NOTE — ED Triage Notes (Signed)
Pt present vaginal discharge with abdominal  And shoulder pain. Symptoms started 3 days ago. Pt states that the pain starts from her vaginal and radiates to her abdominal then left shoulder.

## 2019-02-14 NOTE — Discharge Instructions (Addendum)
UA was inconclusive and was sent for culture. Urine pregnancy test was negative. Given rocephin 250mg  injection and azithromycin 1g in office Urine cytology/ vaginal self swab/ cervical swab obtained Declines HIV/ syphilis testing today Take medications as prescribed and to completion We will follow up with you regarding the results of your test If tests are positive, please abstain from sexual activity for at least 7 days and notify partners Take Tylenol for shoulder pain Follow up with PCP if symptoms persists Return here or go to ER if you have any new or worsening symptoms

## 2019-02-15 LAB — URINE CULTURE

## 2019-02-16 ENCOUNTER — Telehealth: Payer: Self-pay | Admitting: Emergency Medicine

## 2019-02-16 LAB — CERVICOVAGINAL ANCILLARY ONLY
Bacterial vaginitis: POSITIVE — AB
Candida vaginitis: NEGATIVE
Chlamydia: POSITIVE — AB
Neisseria Gonorrhea: POSITIVE — AB
Trichomonas: POSITIVE — AB

## 2019-02-16 MED ORDER — METRONIDAZOLE 500 MG PO TABS: 500.0000 mg | ORAL_TABLET | Freq: Two times a day (BID) | ORAL | 0 refills | Status: AC

## 2019-02-16 NOTE — Telephone Encounter (Signed)
Test for gonorrhea was positive. This was treated at the urgent care visit with IM rocephin 250mg  and po zithromax 1g. Pt needs education to refrain from sexual intercourse for 7 days after treatment to give the medicine time to work. Sexual partners need to be notified and tested/treated. Condoms may reduce risk of reinfection. Recheck or followup with PCP for further evaluation if symptoms are not improving. [facility] notified.   Chlamydia is positive.  This was treated at the urgent care visit with po zithromax 1g.  Pt needs education to please refrain from sexual intercourse for 7 days to give the medicine time to work.  Sexual partners need to be notified and tested/treated.  Condoms may reduce risk of reinfection.  Recheck or followup with PCP for further evaluation if symptoms are not improving.  [facility] notified.  Trichomonas is positive. Rx  for Flagyl  was sent to the pharmacy of record. Pt needs education to refrain from sexual intercourse for 7 days to give the medicine time to work. Sexual partners need to be notified and tested/treated. Condoms may reduce risk of reinfection. Recheck for further evaluation if symptoms are not improving.   Bacterial vaginosis is positive. This was not treated at the urgent care visit.  Flagyl 500 mg BID x 7 days #14 no refills sent to patients pharmacy of choice.    Patient contacted by phone and made aware of    results. Pt verbalized understanding and had all questions answered.

## 2019-08-11 IMAGING — DX DG CHEST 2V
2 series · 2 of 2 positions shown · non-contrast
Comparison: None.

CLINICAL DATA: Right-sided chest pain

EXAM:
CHEST  2 VIEW

[chest pa]
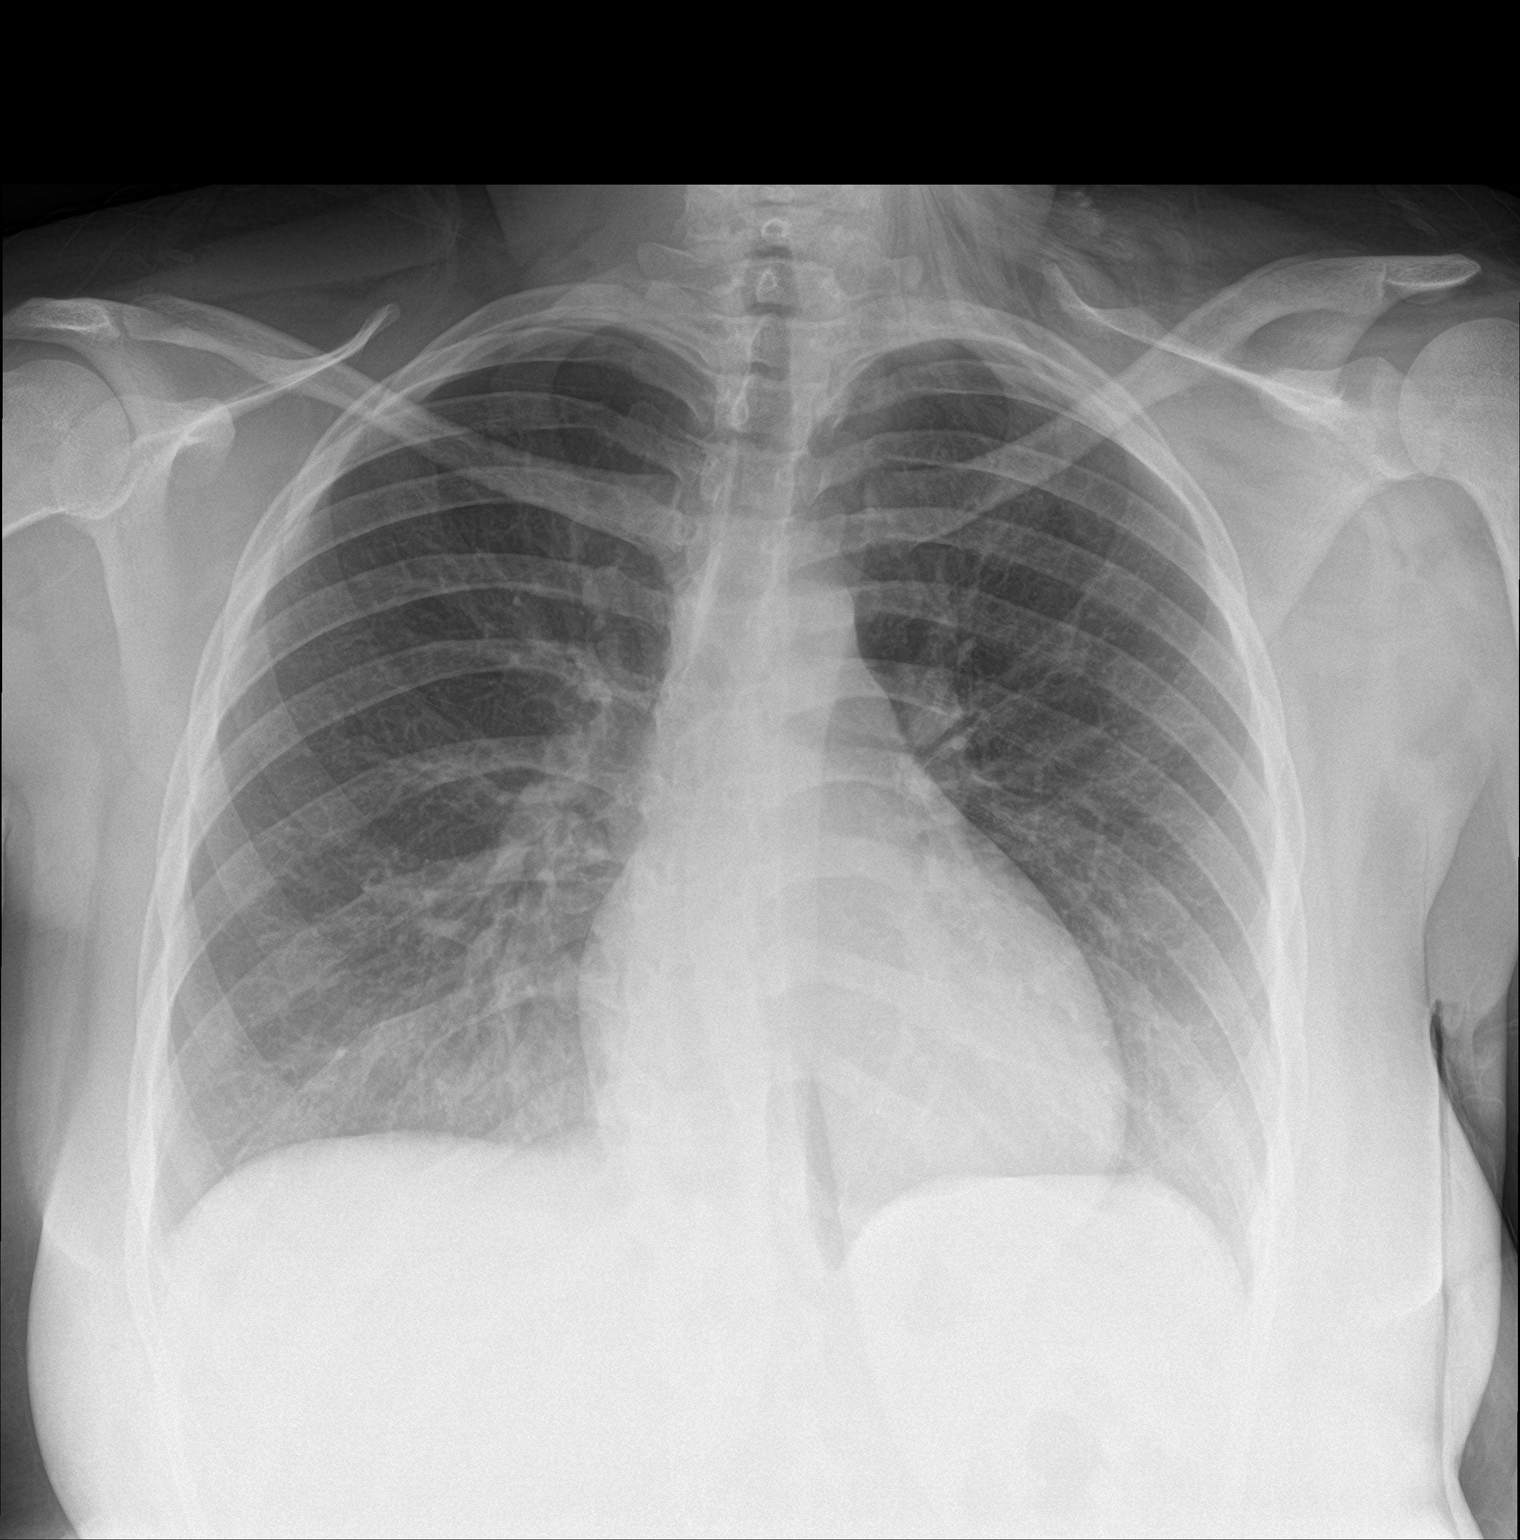

[chest lat]
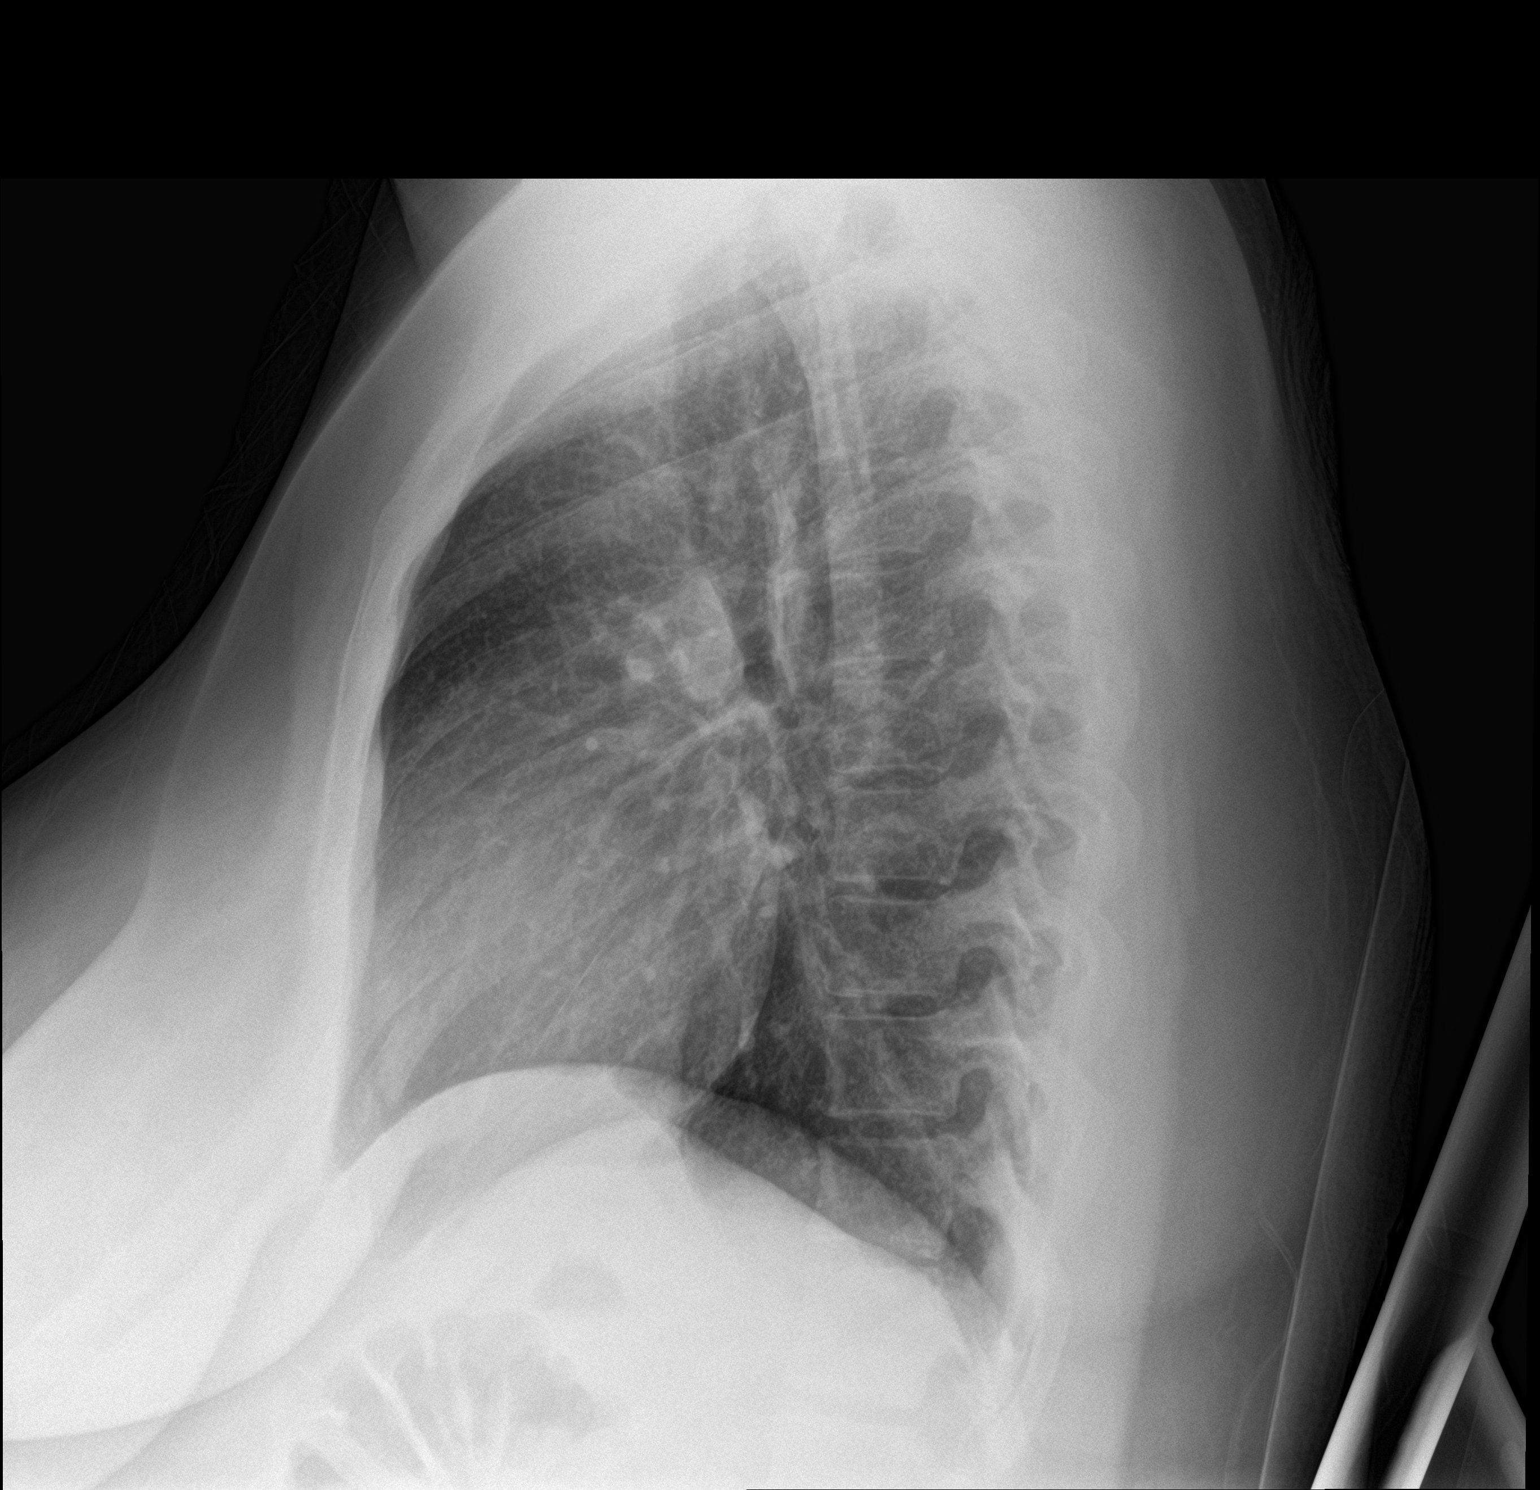

[2 of 2 positions shown; findings below may reference images not displayed]

FINDINGS: The heart size and mediastinal contours are within normal limits.
Both lungs are clear. The visualized skeletal structures are
unremarkable.
IMPRESSION: No active cardiopulmonary disease.

## 2020-11-07 ENCOUNTER — Encounter (HOSPITAL_COMMUNITY): Payer: Self-pay

## 2020-11-07 ENCOUNTER — Other Ambulatory Visit: Payer: Self-pay

## 2020-11-07 ENCOUNTER — Emergency Department (HOSPITAL_COMMUNITY)
Admission: EM | Admit: 2020-11-07 | Discharge: 2020-11-08 | Disposition: A | Discharge status: Home / Self Care | Payer: Medicaid Other | Attending: Emergency Medicine | Admitting: Emergency Medicine

## 2020-11-07 DIAGNOSIS — R350 Frequency of micturition: Secondary | ICD-10-CM | POA: Insufficient documentation

## 2020-11-07 DIAGNOSIS — F17200 Nicotine dependence, unspecified, uncomplicated: Secondary | ICD-10-CM | POA: Insufficient documentation

## 2020-11-07 LAB — URINALYSIS, ROUTINE W REFLEX MICROSCOPIC
Bilirubin Urine: NEGATIVE
Glucose, UA: NEGATIVE mg/dL
Hgb urine dipstick: NEGATIVE
Ketones, ur: 5 mg/dL — AB
Nitrite: NEGATIVE
Protein, ur: NEGATIVE mg/dL
Specific Gravity, Urine: 1.014 (ref 1.005–1.030)
pH: 6 (ref 5.0–8.0)

## 2020-11-07 MED ORDER — NITROFURANTOIN MONOHYD MACRO 100 MG PO CAPS: 100.0000 mg | ORAL_CAPSULE | Freq: Two times a day (BID) | ORAL | 0 refills | Status: AC

## 2020-11-07 NOTE — ED Triage Notes (Signed)
Pt reports odor with urine and white discharge x1 week. Denies frequency/burning with urination, denies pain, denies unprotected intercourse.

## 2020-11-07 NOTE — Discharge Instructions (Addendum)
You were seen and evaluated in the emergency department today for urinary tract infection symptoms.  As we discussed, your urinalysis reveals a developing infection.  I have prescribed Macrobid for your UTI.  Please return to the emergency department if you are experiencing worsening pain, new fevers, flank pain, blood in your urine, or any other concerns you might have.  Please follow-up with your primary care physician in 1 week for reevaluation.

## 2020-11-07 NOTE — ED Provider Notes (Addendum)
[facility]-EMERGENCY DEPT Provider Note   CSN: [id number] Arrival date & time: 11/07/20  1712     History No chief complaint on file.   Lauren Oconnell is a 25 y.o. female who presents to the emergency department today for urinary frequency over the last couple days.  She has had history of urinary tract infections in the past she states that this feels similar.  She denies any urinary urgency, vaginal discharge, vaginal bleeding, fever, chills, abdominal pain, dysuria, and hematuria.  HPI     Past Medical History:  Diagnosis Date   Urticaria     Patient Active Problem List   Diagnosis Date Noted   Chlamydia 01/09/2019   Trichomonas infection 01/09/2019   OCP (oral contraceptive pills) initiation 12/27/2018    Past Surgical History:  Procedure Laterality Date   WISDOM TOOTH EXTRACTION  2016     OB History     Gravida  1   Para  1   Term  1   Preterm      AB      Living  1      SAB      IAB      Ectopic      Multiple      Live Births  1           Family History  Problem Relation Age of Onset   Asthma Brother    Allergic rhinitis Brother    Food Allergy Mother    Hypertension Mother    Angioedema Neg Hx    Eczema Neg Hx    Immunodeficiency Neg Hx    Urticaria Neg Hx     Social History   Tobacco Use   Smoking status: Some Days   Smokeless tobacco: Never  Vaping Use   Vaping Use: Never used  Substance Use Topics   Alcohol use: No   Drug use: Yes    Types: Marijuana    Home Medications Prior to Admission medications   Medication Sig Start Date End Date Taking? Authorizing Provider  nitrofurantoin, macrocrystal-monohydrate, (MACROBID) 100 MG capsule Take 1 capsule (100 mg total) by mouth 2 (two) times daily. 11/07/20  Yes Meredeth Ide, Lory Nowaczyk M, PA-C  acetaminophen (TYLENOL) 325 MG tablet Take 2 tablets (650 mg total) by mouth every 6 (six) hours as needed. 02/14/19   Avegno, Zachery Dakins, FNP  diphenhydrAMINE  (BENADRYL) 25 mg capsule Take 25 mg by mouth every 6 (six) hours as needed.    [provider]  levocetirizine (XYZAL) 5 MG tablet Take 1-2 tablets (5-10 mg total) by mouth every morning. 11/15/18   Alfonse Spruce, MD  metroNIDAZOLE (FLAGYL) 500 MG tablet Take 1 tablet (500 mg total) by mouth 2 (two) times daily. 01/04/19   Leftwich-Kirby, Wilmer Floor, CNM  metroNIDAZOLE (METROGEL) 0.75 % vaginal gel Place 1 Applicatorful vaginally at bedtime. Apply one applicatorful to vagina at bedtime for 5 days 12/27/18   Leftwich-Kirby, Misty Stanley A, CNM  montelukast (SINGULAIR) 10 MG tablet Take 1 tablet (10 mg total) by mouth at bedtime. 11/15/18   Alfonse Spruce, MD  Norethindrone-Ethinyl Estradiol-Fe Biphas (LO LOESTRIN FE) 1 MG-10 MCG / 10 MCG tablet Take 1 tablet by mouth daily. 12/27/18   Leftwich-Kirby, Wilmer Floor, CNM    Allergies    Patient has no known allergies.  Review of Systems   Review of Systems  All other systems reviewed and are negative.  Physical Exam Updated Vital Signs BP 138/87 (BP Location: Left  Arm)   Pulse 61   Temp 99.5 F (37.5 C) (Oral)   Resp 16   Ht 5\' 4"  (1.626 m)   Wt 89 kg   SpO2 98%   BMI 33.68 kg/m   Physical Exam Vitals reviewed.  Constitutional:      Appearance: Normal appearance.  HENT:     Head: Normocephalic and atraumatic.  Eyes:     General:        Right eye: No discharge.        Left eye: No discharge.     Conjunctiva/sclera: Conjunctivae normal.  Pulmonary:     Effort: Pulmonary effort is normal.  Skin:    General: Skin is warm and dry.     Findings: No rash.  Neurological:     General: No focal deficit present.     Mental Status: She is alert.  Psychiatric:        Mood and Affect: Mood normal.        Behavior: Behavior normal.    ED Results / Procedures / Treatments   Labs (all labs ordered are listed, but only abnormal results are displayed) Labs Reviewed  URINALYSIS, ROUTINE W REFLEX MICROSCOPIC - Abnormal; Notable for  the following components:      Result Value   APPearance HAZY (*)    Ketones, ur 5 (*)    Leukocytes,Ua TRACE (*)    Bacteria, UA RARE (*)    All other components within normal limits  URINE CULTURE    EKG None  Radiology No results found.  Procedures Procedures   Medications Ordered in ED Medications - No data to display  ED Course  I have reviewed the triage vital signs and the nursing notes.  Pertinent labs & imaging results that were available during my care of the patient were reviewed by me and considered in my medical decision making (see chart for details).  Clinical Course as of 11/07/20 1950  Thu Nov 07, 2020  1934 Bacteria, UA(!): RARE [CF]    Clinical Course User Index [CF] Nov 09, 2020   MDM Rules/Calculators/A&P                          Lauren Oconnell is a 25 y.o. female who presents to the emergency department today for urinary frequency.  Given the clinical scenario, I feel that she has a developing urinary tract infection.  The symptoms are similar to her previous urinary tract infections in the past.  Urinalysis supports this.  I will treat with Macrobid for 5 days.  Strict return precautions given.  Will have her follow-up with her primary care physician.  I went to speak with the patient to discuss return precautions and she was nowhere to be found.  Nursing went out to the waiting room and outside and called for her name numerous times with no answer.  It is possible that she eloped prior to discharge.  Unable to discuss results with her.  Final Clinical Impression(s) / ED Diagnoses Final diagnoses:  Urinary frequency    Rx / DC Orders ED Discharge Orders          Ordered    nitrofurantoin, macrocrystal-monohydrate, (MACROBID) 100 MG capsule  2 times daily        11/07/20 1939             01/07/21, Teressa Lower 11/07/20 1942    01/07/21 [place], PA-C 11/07/20 1951  Linwood Dibbles, MD 11/09/20 [phone]

## 2020-11-08 NOTE — ED Notes (Addendum)
Pt called 3x for treatment room. Eloped prior to receiving discharge papers.

## 2020-11-09 LAB — URINE CULTURE

## 2020-12-16 ENCOUNTER — Other Ambulatory Visit (HOSPITAL_COMMUNITY)
Admission: RE | Admit: 2020-12-16 | Discharge: 2020-12-16 | Disposition: A | Discharge status: Home / Self Care | Payer: Medicaid Other | Source: Ambulatory Visit | Attending: Advanced Practice Midwife | Admitting: Advanced Practice Midwife

## 2020-12-16 ENCOUNTER — Encounter: Payer: Self-pay | Admitting: Advanced Practice Midwife

## 2020-12-16 ENCOUNTER — Other Ambulatory Visit: Payer: Self-pay

## 2020-12-16 ENCOUNTER — Ambulatory Visit (INDEPENDENT_AMBULATORY_CARE_PROVIDER_SITE_OTHER): Payer: Medicaid Other | Admitting: Advanced Practice Midwife

## 2020-12-16 VITALS — BP 131/79 | HR 84 | Ht 64.0 in | Wt 228.6 lb

## 2020-12-16 DIAGNOSIS — Z01419 Encounter for gynecological examination (general) (routine) without abnormal findings: Secondary | ICD-10-CM

## 2020-12-16 DIAGNOSIS — Z113 Encounter for screening for infections with a predominantly sexual mode of transmission: Secondary | ICD-10-CM | POA: Diagnosis not present

## 2020-12-16 DIAGNOSIS — Z8619 Personal history of other infectious and parasitic diseases: Secondary | ICD-10-CM | POA: Diagnosis not present

## 2020-12-16 DIAGNOSIS — Z3009 Encounter for other general counseling and advice on contraception: Secondary | ICD-10-CM | POA: Diagnosis not present

## 2020-12-16 DIAGNOSIS — Z3169 Encounter for other general counseling and advice on procreation: Secondary | ICD-10-CM

## 2020-12-16 NOTE — Progress Notes (Signed)
   Subjective:     Lauren Oconnell is a 25 y.o. female here at [facility] for a routine exam.  Current complaints: none.  Personal health questionnaire reviewed: yes.  Do you have a primary care provider? yes Do you feel safe at home? yes    Health Maintenance Due  Topic Date Due   COVID-19 Vaccine (1) Never done   DTAP VACCINES (5) 08/02/2001   HPV VACCINES (2 - 3-dose series) 03/04/2015   CHLAMYDIA SCREENING  02/14/2020   INFLUENZA VACCINE  09/30/2020     Risk factors for chronic health problems: Smoking: Alchohol/how much: Pt BMI: There is no height or weight on file to calculate BMI.   Gynecologic History No LMP recorded. Contraception: none Last Pap: 12/27/2018. Results were: normal Last mammogram: n/a. Results were:   Obstetric History OB History  Gravida Para Term Preterm AB Living  1 1 1     1   SAB IAB Ectopic Multiple Live Births          1    # Outcome Date GA Lbr Len/2nd Weight Sex Delivery Anes PTL Lv  1 Term 07/10/16 [redacted]w[redacted]d 06:59 / 00:29 6 lb 2.3 oz (2.787 kg) M Vag-Spont EPI, Local  LIV     Birth Comments: none     The following portions of the patient's history were reviewed and updated as appropriate: allergies, current medications, past family history, past medical history, past social history, past surgical history, and problem list.  Review of Systems Pertinent items noted in HPI and remainder of comprehensive ROS otherwise negative.    Objective:   There were no vitals taken for this visit. VS reviewed, nursing note reviewed,  Constitutional: well developed, well nourished, no distress HEENT: normocephalic CV: normal rate Pulm/chest wall: normal effort Breast Exam:  Deferred with low risks and shared decision making, discussed recommendation to start mammogram between 7-50 yo Abdomen: soft Neuro: alert and oriented x 3 Skin: warm, dry Psych: affect normal Pelvic exam: Performed: Cervix pink, visually closed, without lesion, scant white  creamy discharge, vaginal walls and external genitalia normal Bimanual exam: Cervix 0/long/high, firm, anterior, neg CMT, uterus nontender, nonenlarged, adnexa without tenderness, enlargement, or mass       Assessment/Plan:   1. Well woman exam with routine gynecological exam --No Gyn concerns or problems, regular menses --Pap in 2020 wnl. Discussed with pt.  Can return for Pap as scheduled  next year, or Pap today and 3 years from now if normal. Pt opts for Pap today. - Cytology - PAP  2. Screening examination for STD (sexually transmitted disease) - Cervicovaginal ancillary only( [place]) - HIV Antibody (routine testing w rflx) - RPR  3. Encounter for counseling regarding contraception --Pt desires pregnancy, see preconception counseling  4. History of sexually transmitted disease --Gonorrhea, chlamydia, and trichomonas treated 01/2019   5. Pre-conception counseling --Pt to take PNV or OTC iron and folic acid supplement starting today. --Healthy lifestyle discussed, recommend early prenatal care     Follow up in: 1  year  or as needed.   03/2019, CNM 8:26 AM

## 2020-12-16 NOTE — Progress Notes (Signed)
Annual Normal Pap 2020 Desires STD swab and blood testing: [employer]- swab pended for GC/CC only, blood pended for HIV and RPR only. Desires pregnancy, has been "trying"

## 2020-12-17 LAB — CERVICOVAGINAL ANCILLARY ONLY
Chlamydia: POSITIVE — AB
Comment: NEGATIVE
Comment: NORMAL
Neisseria Gonorrhea: NEGATIVE

## 2020-12-17 LAB — HIV ANTIBODY (ROUTINE TESTING W REFLEX): HIV Screen 4th Generation wRfx: NONREACTIVE

## 2020-12-17 LAB — RPR: RPR Ser Ql: NONREACTIVE

## 2020-12-18 ENCOUNTER — Other Ambulatory Visit: Payer: Self-pay | Admitting: Advanced Practice Midwife

## 2020-12-18 ENCOUNTER — Other Ambulatory Visit: Payer: Self-pay

## 2020-12-18 DIAGNOSIS — A749 Chlamydial infection, unspecified: Secondary | ICD-10-CM

## 2020-12-18 LAB — CYTOLOGY - PAP: Diagnosis: NEGATIVE

## 2020-12-18 MED ORDER — METRONIDAZOLE 500 MG PO TABS: 500.0000 mg | ORAL_TABLET | Freq: Two times a day (BID) | ORAL | 1 refills | Status: DC

## 2020-12-18 MED ORDER — AZITHROMYCIN 500 MG PO TABS: 1000.0000 mg | ORAL_TABLET | Freq: Once | ORAL | 0 refills | Status: DC

## 2020-12-18 MED ORDER — AZITHROMYCIN 500 MG PO TABS: 1000.0000 mg | ORAL_TABLET | Freq: Once | ORAL | 0 refills | Status: AC

## 2020-12-18 NOTE — Progress Notes (Signed)
Prescriptions sent and pt received communication via phone and MyChart for positive chlamydia and trichomonas.

## 2021-02-12 ENCOUNTER — Ambulatory Visit: Payer: Medicaid Other

## 2021-04-02 ENCOUNTER — Other Ambulatory Visit: Payer: Self-pay

## 2021-04-02 ENCOUNTER — Ambulatory Visit (INDEPENDENT_AMBULATORY_CARE_PROVIDER_SITE_OTHER): Payer: Medicaid Other | Admitting: *Deleted

## 2021-04-02 ENCOUNTER — Other Ambulatory Visit (HOSPITAL_COMMUNITY)
Admission: RE | Admit: 2021-04-02 | Discharge: 2021-04-02 | Disposition: A | Discharge status: Home / Self Care | Payer: Medicaid Other | Source: Ambulatory Visit | Attending: Obstetrics and Gynecology | Admitting: Obstetrics and Gynecology

## 2021-04-02 DIAGNOSIS — N898 Other specified noninflammatory disorders of vagina: Secondary | ICD-10-CM | POA: Diagnosis not present

## 2021-04-02 DIAGNOSIS — Z8619 Personal history of other infectious and parasitic diseases: Secondary | ICD-10-CM | POA: Insufficient documentation

## 2021-04-02 DIAGNOSIS — N760 Acute vaginitis: Secondary | ICD-10-CM | POA: Diagnosis not present

## 2021-04-02 NOTE — Progress Notes (Signed)
Agree with nurses's documentation of this patient's clinic encounter.  Jhordan Mckibben L, MD  

## 2021-04-02 NOTE — Progress Notes (Signed)
Lauren Oconnell presents today for Self Swab for TOC and vaginal d/c with odor x 3 days.    OBJECTIVE: Appears well, in no apparent distress.  OB History     Gravida  1   Para  1   Term  1   Preterm      AB      Living  1      SAB      IAB      Ectopic      Multiple      Live Births  1          I have reviewed the patient's allergies and medications.   ASSESSMENT: Vaginal d/c                            Vaginal odor                            Self Swab  PLAN Full panel self swab ordered.   Will await results for treatment.

## 2021-04-04 LAB — CERVICOVAGINAL ANCILLARY ONLY
Bacterial Vaginitis (gardnerella): POSITIVE — AB
Candida Glabrata: NEGATIVE
Candida Vaginitis: NEGATIVE
Chlamydia: NEGATIVE
Comment: NEGATIVE
Comment: NEGATIVE
Comment: NEGATIVE
Comment: NEGATIVE
Comment: NEGATIVE
Comment: NORMAL
Neisseria Gonorrhea: NEGATIVE
Trichomonas: NEGATIVE

## 2021-04-07 ENCOUNTER — Other Ambulatory Visit: Payer: Self-pay | Admitting: *Deleted

## 2021-04-07 DIAGNOSIS — N760 Acute vaginitis: Secondary | ICD-10-CM

## 2021-04-07 DIAGNOSIS — B9689 Other specified bacterial agents as the cause of diseases classified elsewhere: Secondary | ICD-10-CM

## 2021-04-07 MED ORDER — METRONIDAZOLE 500 MG PO TABS: 500.0000 mg | ORAL_TABLET | Freq: Two times a day (BID) | ORAL | 0 refills | Status: AC

## 2021-04-07 NOTE — Progress Notes (Signed)
Flagy sent for +BV See lab result

## 2021-07-08 ENCOUNTER — Ambulatory Visit: Payer: Medicaid Other

## 2021-07-16 ENCOUNTER — Other Ambulatory Visit (HOSPITAL_COMMUNITY)
Admission: RE | Admit: 2021-07-16 | Discharge: 2021-07-16 | Disposition: A | Discharge status: Home / Self Care | Payer: Medicaid Other | Source: Ambulatory Visit | Attending: Obstetrics & Gynecology | Admitting: Obstetrics & Gynecology

## 2021-07-16 ENCOUNTER — Ambulatory Visit (INDEPENDENT_AMBULATORY_CARE_PROVIDER_SITE_OTHER): Payer: Medicaid Other

## 2021-07-16 DIAGNOSIS — N898 Other specified noninflammatory disorders of vagina: Secondary | ICD-10-CM

## 2021-07-16 DIAGNOSIS — R350 Frequency of micturition: Secondary | ICD-10-CM

## 2021-07-16 LAB — POCT URINALYSIS DIPSTICK
Bilirubin, UA: NEGATIVE
Blood, UA: NEGATIVE
Glucose, UA: NEGATIVE
Leukocytes, UA: NEGATIVE
Nitrite, UA: NEGATIVE
Odor: POSITIVE
Protein, UA: NEGATIVE
Spec Grav, UA: 1.01 (ref 1.010–1.025)
Urobilinogen, UA: 0.2 E.U./dL
pH, UA: 7.5 (ref 5.0–8.0)

## 2021-07-16 NOTE — Progress Notes (Signed)
..  SUBJECTIVE: Lauren Oconnell is a 26 y.o. female who complains of urinary frequency, urgency, and odor x 3 days, Denies flank pain, fever, chills, or bleeding. Pt also reports white, vaginal discharge with irritation.  OBJECTIVE: Appears well, in no apparent distress.  Vital signs are normal. Urine dipstick shows positive for ketones.    ASSESSMENT: Dysuria Vaginal discharge  PLAN: Treatment per orders.  Call or return to clinic prn if these symptoms worsen or fail to improve as anticipated. Self swab performed

## 2021-07-17 LAB — CERVICOVAGINAL ANCILLARY ONLY
Bacterial Vaginitis (gardnerella): POSITIVE — AB
Candida Glabrata: NEGATIVE
Candida Vaginitis: NEGATIVE
Chlamydia: NEGATIVE
Comment: NEGATIVE
Comment: NEGATIVE
Comment: NEGATIVE
Comment: NEGATIVE
Comment: NEGATIVE
Comment: NORMAL
Neisseria Gonorrhea: NEGATIVE
Trichomonas: NEGATIVE

## 2021-07-18 LAB — URINE CULTURE

## 2021-07-23 ENCOUNTER — Other Ambulatory Visit: Payer: Self-pay

## 2021-07-23 DIAGNOSIS — B9689 Other specified bacterial agents as the cause of diseases classified elsewhere: Secondary | ICD-10-CM

## 2021-07-23 MED ORDER — METRONIDAZOLE 500 MG PO TABS: 500.0000 mg | ORAL_TABLET | Freq: Two times a day (BID) | ORAL | 0 refills | Status: AC

## 2021-12-01 DIAGNOSIS — Z0389 Encounter for observation for other suspected diseases and conditions ruled out: Secondary | ICD-10-CM | POA: Diagnosis not present

## 2021-12-01 DIAGNOSIS — Z202 Contact with and (suspected) exposure to infections with a predominantly sexual mode of transmission: Secondary | ICD-10-CM | POA: Diagnosis not present

## 2021-12-01 DIAGNOSIS — Z113 Encounter for screening for infections with a predominantly sexual mode of transmission: Secondary | ICD-10-CM | POA: Diagnosis not present

## 2023-06-13 DIAGNOSIS — R1030 Lower abdominal pain, unspecified: Secondary | ICD-10-CM | POA: Diagnosis not present

## 2023-06-13 DIAGNOSIS — R300 Dysuria: Secondary | ICD-10-CM | POA: Diagnosis not present
